# Patient Record
Sex: Male | Born: 1970 | Race: White | Hispanic: No | Marital: Married | Smoking: Never smoker
Health system: Southern US, Community
[De-identification: ages and names within clinical notes are randomized; demographics above are authoritative.]

## PROBLEM LIST (undated history)

## (undated) DIAGNOSIS — E7800 Pure hypercholesterolemia, unspecified: Secondary | ICD-10-CM

## (undated) DIAGNOSIS — G43909 Migraine, unspecified, not intractable, without status migrainosus: Secondary | ICD-10-CM

## (undated) DIAGNOSIS — I10 Essential (primary) hypertension: Secondary | ICD-10-CM

## (undated) DIAGNOSIS — J45909 Unspecified asthma, uncomplicated: Secondary | ICD-10-CM

## (undated) HISTORY — DX: Migraine, unspecified, not intractable, without status migrainosus: G43.909

## (undated) HISTORY — DX: Unspecified asthma, uncomplicated: J45.909

## (undated) HISTORY — DX: Pure hypercholesterolemia, unspecified: E78.00

## (undated) HISTORY — DX: Essential (primary) hypertension: I10

---

## 2008-11-15 ENCOUNTER — Emergency Department (HOSPITAL_BASED_OUTPATIENT_CLINIC_OR_DEPARTMENT_OTHER): Admission: EM | Admit: 2008-11-15 | Discharge: 2008-11-15 | Payer: Self-pay | Admitting: Emergency Medicine

## 2008-11-15 ENCOUNTER — Ambulatory Visit: Payer: Self-pay | Admitting: Diagnostic Radiology

## 2009-06-27 ENCOUNTER — Encounter: Admission: RE | Admit: 2009-06-27 | Discharge: 2009-06-27 | Payer: Self-pay | Admitting: Family Medicine

## 2009-07-18 ENCOUNTER — Encounter: Admission: RE | Admit: 2009-07-18 | Discharge: 2009-07-18 | Payer: Self-pay | Admitting: Family Medicine

## 2009-07-18 ENCOUNTER — Other Ambulatory Visit: Admission: RE | Admit: 2009-07-18 | Discharge: 2009-07-18 | Payer: Self-pay | Admitting: Interventional Radiology

## 2010-05-13 ENCOUNTER — Encounter: Payer: Self-pay | Admitting: Family Medicine

## 2010-06-29 ENCOUNTER — Other Ambulatory Visit: Payer: Self-pay | Admitting: Family Medicine

## 2010-06-29 ENCOUNTER — Other Ambulatory Visit: Payer: Self-pay | Admitting: *Deleted

## 2010-06-29 DIAGNOSIS — E049 Nontoxic goiter, unspecified: Secondary | ICD-10-CM

## 2010-07-03 ENCOUNTER — Ambulatory Visit
Admission: RE | Admit: 2010-07-03 | Discharge: 2010-07-03 | Disposition: A | Discharge status: Home / Self Care | Payer: Self-pay | Source: Ambulatory Visit | Attending: Family Medicine | Admitting: Family Medicine

## 2010-07-03 DIAGNOSIS — E049 Nontoxic goiter, unspecified: Secondary | ICD-10-CM

## 2010-09-16 IMAGING — US US BIOPSY
1 series · 13 of 18 positions shown · non-contrast
Comparison: none

CLINICAL DATA: Patient with history of multinodular goiter and
thyroid ultrasound on 06/27/2009 which revealed a dominant
primarily cystic nodule in the left thyroid lobe measuring 3.4 x
2.3 x 2.7 cm.  Request is now made for needle aspirate biopsy of
this dominant left thyroid nodule.

ULTRASOUND-GUIDED NEEDLE ASPIRATE BIOPSY, DOMINANT LEFT THYROID
CYSTIC NODULE
The above procedure was discussed with the patient and written
informed consent was obtained.

[Series 1: us biopsy · 0.07mm/px · 18 acquisitions, 13 frames shown]
[im 1/18]
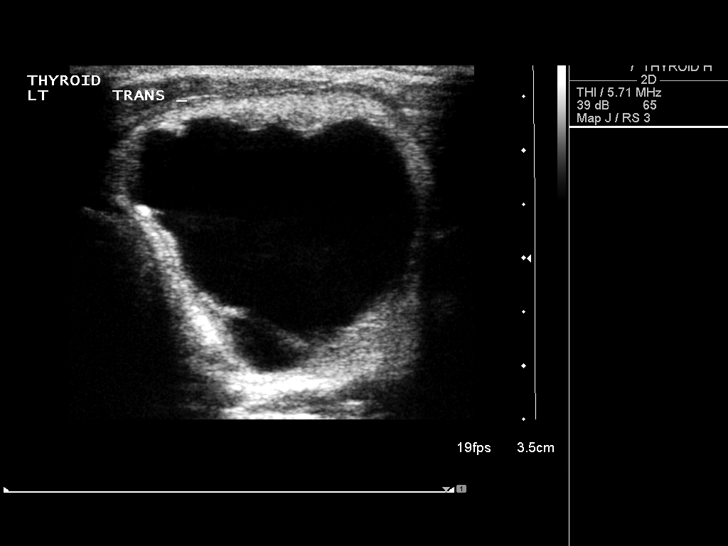
[im 3/18]
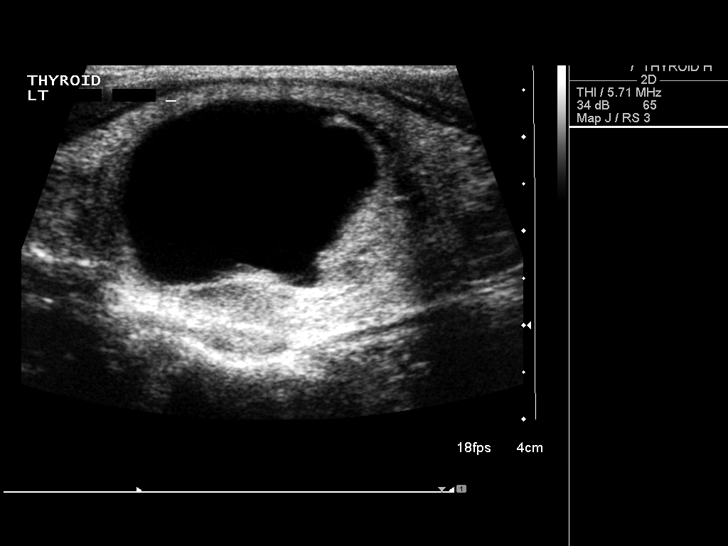
[im 4/18]
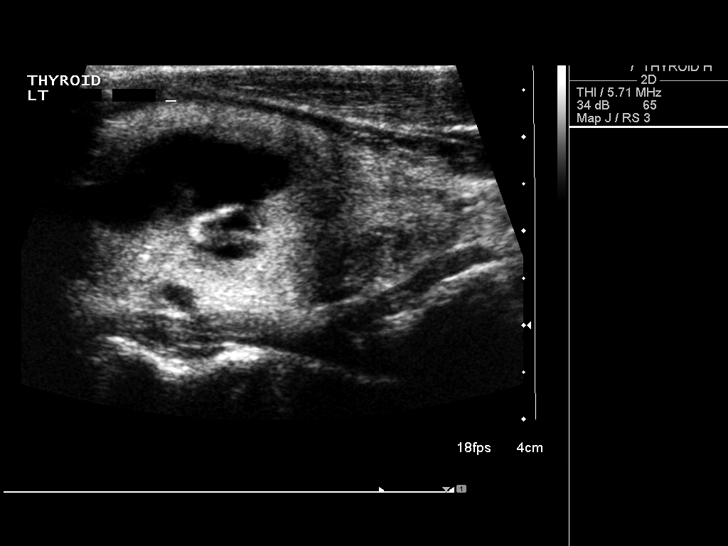
[im 5/18]
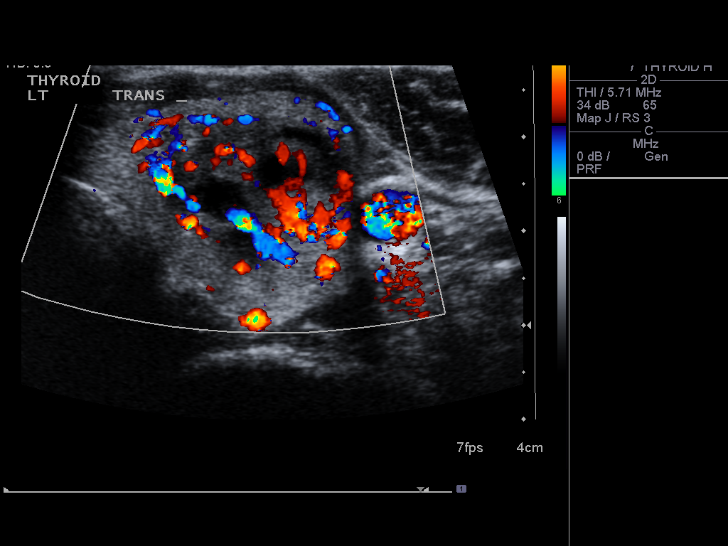
[im 7/18]
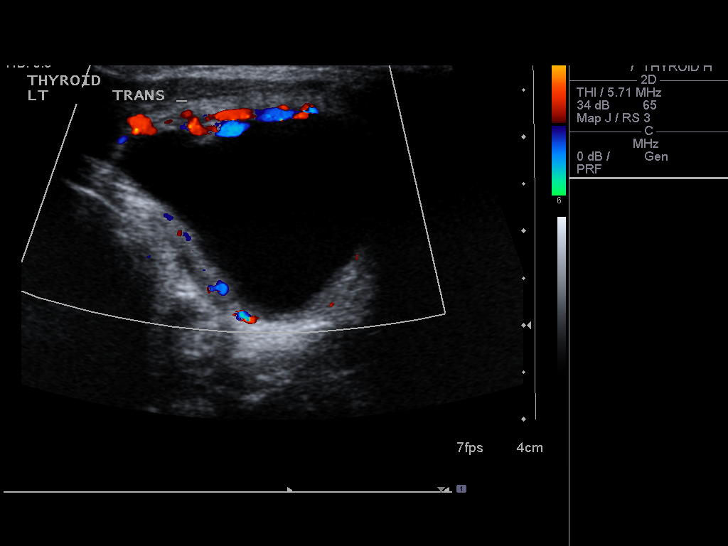
[im 8/18]
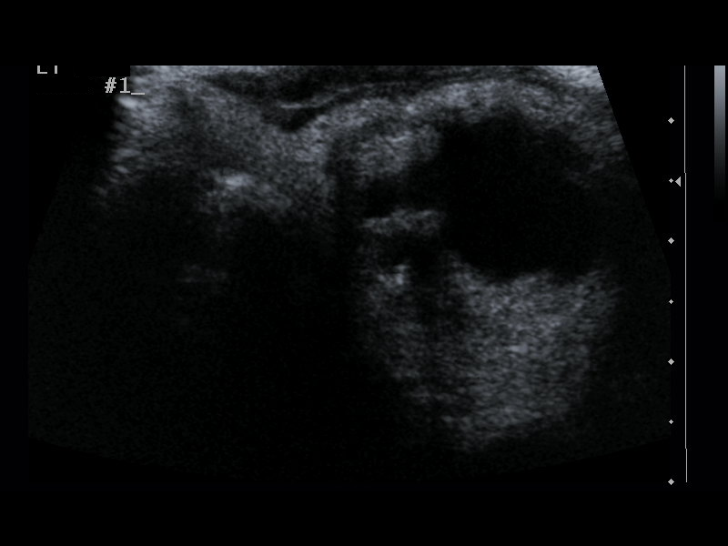
[im 10/18]
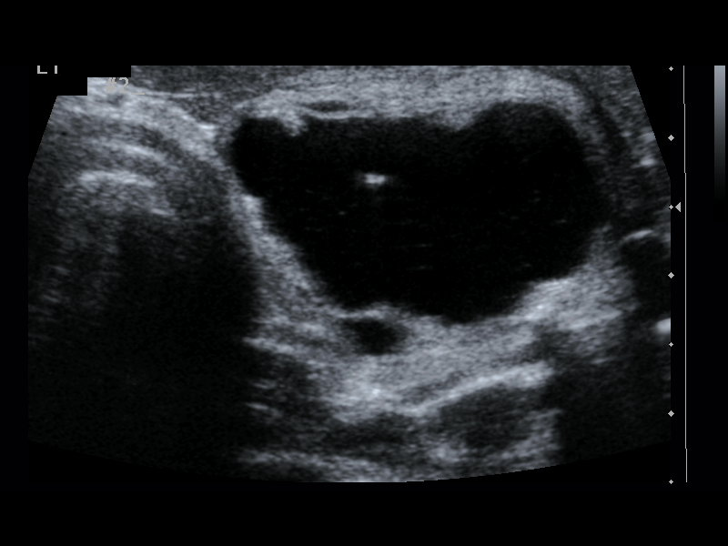
[im 11/18]
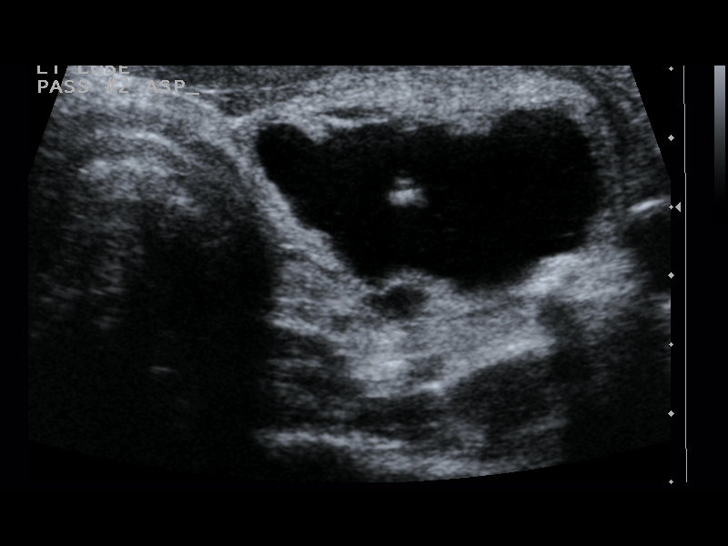
[im 12/18]
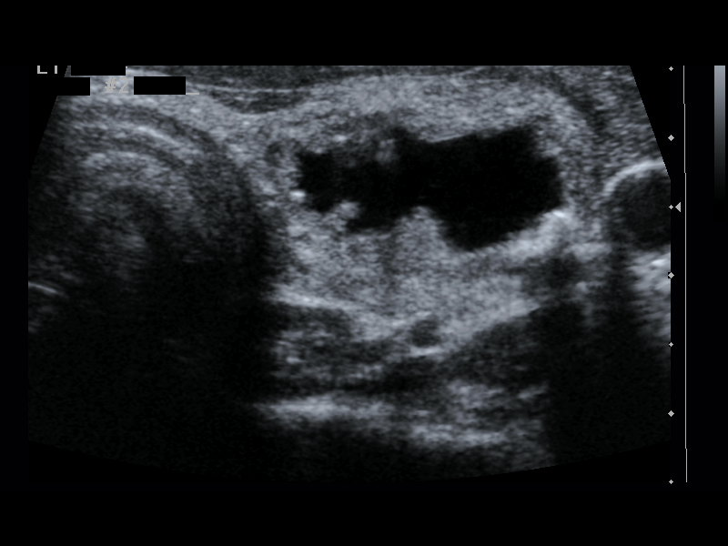
[im 14/18]
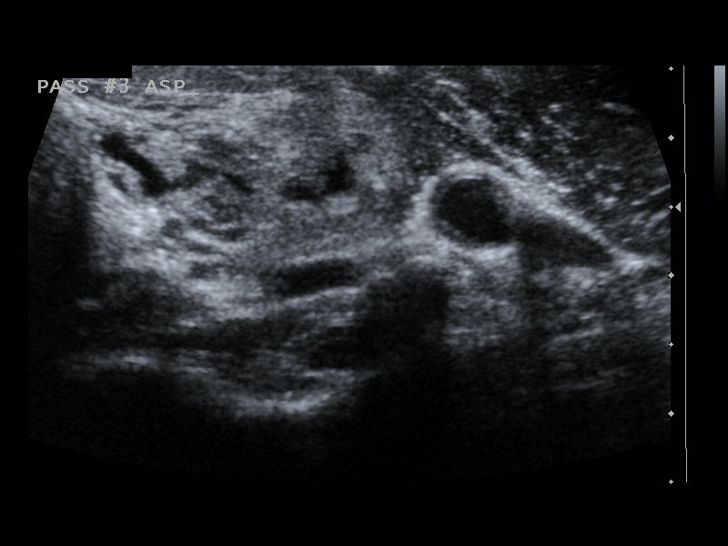
[im 15/18]
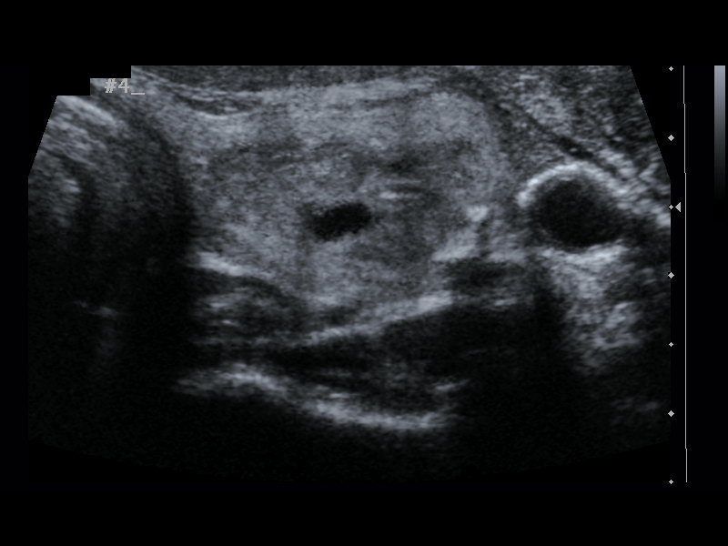
[im 16/18]
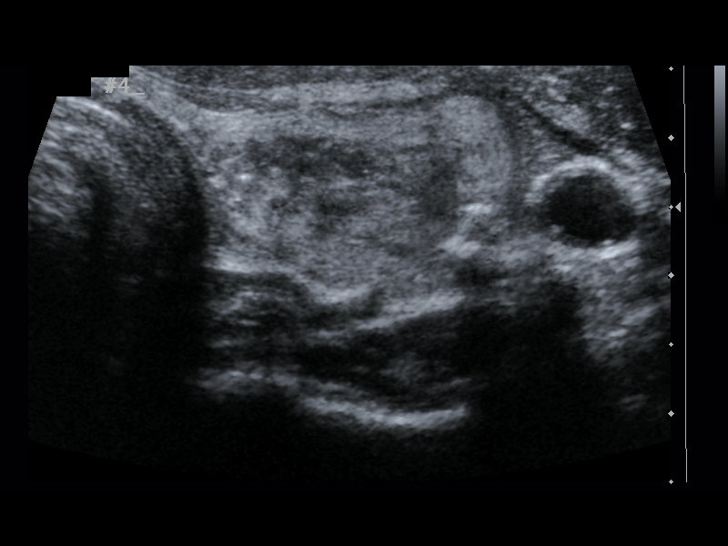
[im 18/18]
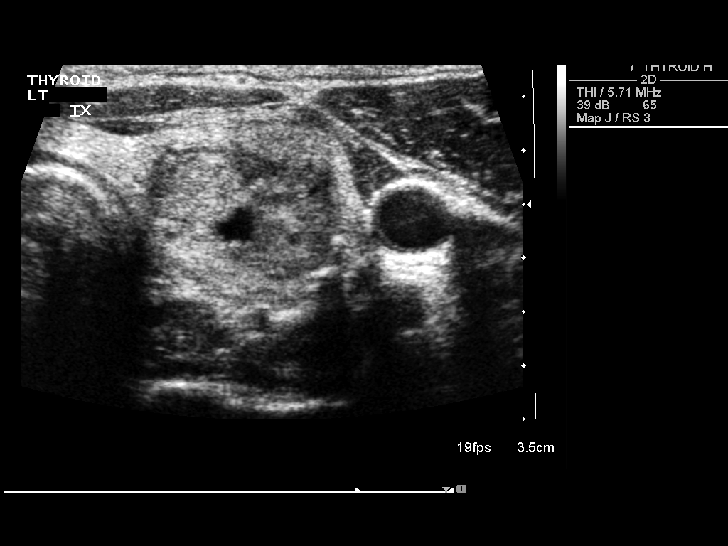

[13 of 18 positions shown; findings below may reference images not displayed]

FINDINGS: Ultrasound was performed to localize and mark an adequate
site for the biopsy.  The patient was then prepped and draped in a
normal sterile fashion.  Local anesthesia was provided with 1%
lidocaine.  Under direct ultrasound guidance, 4 passes were made
using 25 gauge needles into the dominant cystic nodule within the
left lobe of the thyroid.  Ultrasound was used to confirm needle
placements on all occasions.  Specimens were sent to Pathology for
analysis.  Post procedural imaging demonstrated no hematoma or
immediate complication.  The patient tolerated the procedure well.
IMPRESSION: Successful ultrasound guided needle aspirate biopsy of a dominant
cystic left thyroid nodule.  Final pathology pending.

Read by: Mcpeak, Ayushi.-PANTOJA

## 2011-06-27 ENCOUNTER — Other Ambulatory Visit: Payer: Self-pay | Admitting: Family Medicine

## 2011-06-27 DIAGNOSIS — E042 Nontoxic multinodular goiter: Secondary | ICD-10-CM

## 2011-07-26 ENCOUNTER — Other Ambulatory Visit: Payer: Self-pay

## 2011-07-30 ENCOUNTER — Other Ambulatory Visit: Payer: Self-pay

## 2011-08-12 ENCOUNTER — Ambulatory Visit
Admission: RE | Admit: 2011-08-12 | Discharge: 2011-08-12 | Disposition: A | Discharge status: Home / Self Care | Payer: Managed Care, Other (non HMO) | Source: Ambulatory Visit | Attending: Family Medicine | Admitting: Family Medicine

## 2011-08-12 DIAGNOSIS — E042 Nontoxic multinodular goiter: Secondary | ICD-10-CM

## 2013-07-01 ENCOUNTER — Other Ambulatory Visit: Payer: Self-pay | Admitting: Family Medicine

## 2013-07-01 DIAGNOSIS — E049 Nontoxic goiter, unspecified: Secondary | ICD-10-CM

## 2013-07-06 ENCOUNTER — Ambulatory Visit
Admission: RE | Admit: 2013-07-06 | Discharge: 2013-07-06 | Disposition: A | Discharge status: Home / Self Care | Payer: BC Managed Care – PPO | Source: Ambulatory Visit | Attending: Family Medicine | Admitting: Family Medicine

## 2013-07-06 DIAGNOSIS — E049 Nontoxic goiter, unspecified: Secondary | ICD-10-CM

## 2013-07-07 ENCOUNTER — Other Ambulatory Visit: Payer: Self-pay | Admitting: Family Medicine

## 2013-07-07 DIAGNOSIS — E041 Nontoxic single thyroid nodule: Secondary | ICD-10-CM

## 2013-07-13 ENCOUNTER — Other Ambulatory Visit (HOSPITAL_COMMUNITY)
Admission: RE | Admit: 2013-07-13 | Discharge: 2013-07-13 | Disposition: A | Discharge status: Home / Self Care | Payer: BC Managed Care – PPO | Source: Ambulatory Visit | Attending: Interventional Radiology | Admitting: Interventional Radiology

## 2013-07-13 ENCOUNTER — Ambulatory Visit
Admission: RE | Admit: 2013-07-13 | Discharge: 2013-07-13 | Disposition: A | Discharge status: Home / Self Care | Payer: BC Managed Care – PPO | Source: Ambulatory Visit | Attending: Family Medicine | Admitting: Family Medicine

## 2013-07-13 DIAGNOSIS — E041 Nontoxic single thyroid nodule: Secondary | ICD-10-CM

## 2013-12-28 ENCOUNTER — Telehealth: Payer: Self-pay | Admitting: Endocrinology

## 2013-12-28 NOTE — Telephone Encounter (Signed)
Created in error

## 2014-05-17 ENCOUNTER — Other Ambulatory Visit: Payer: Self-pay | Admitting: Family Medicine

## 2014-05-17 DIAGNOSIS — E042 Nontoxic multinodular goiter: Secondary | ICD-10-CM

## 2014-06-21 ENCOUNTER — Ambulatory Visit
Admission: RE | Admit: 2014-06-21 | Discharge: 2014-06-21 | Disposition: A | Discharge status: Home / Self Care | Payer: BLUE CROSS/BLUE SHIELD | Source: Ambulatory Visit | Attending: Family Medicine | Admitting: Family Medicine

## 2014-06-21 DIAGNOSIS — E042 Nontoxic multinodular goiter: Secondary | ICD-10-CM

## 2015-06-14 ENCOUNTER — Other Ambulatory Visit: Payer: Self-pay | Admitting: Family Medicine

## 2015-06-14 DIAGNOSIS — E041 Nontoxic single thyroid nodule: Secondary | ICD-10-CM

## 2015-06-22 ENCOUNTER — Ambulatory Visit
Admission: RE | Admit: 2015-06-22 | Discharge: 2015-06-22 | Disposition: A | Discharge status: Home / Self Care | Payer: BLUE CROSS/BLUE SHIELD | Source: Ambulatory Visit | Attending: Family Medicine | Admitting: Family Medicine

## 2015-06-22 DIAGNOSIS — E041 Nontoxic single thyroid nodule: Secondary | ICD-10-CM

## 2015-12-12 DIAGNOSIS — I10 Essential (primary) hypertension: Secondary | ICD-10-CM | POA: Diagnosis not present

## 2015-12-12 DIAGNOSIS — F419 Anxiety disorder, unspecified: Secondary | ICD-10-CM | POA: Diagnosis not present

## 2015-12-12 DIAGNOSIS — Z0000 Encounter for general adult medical examination without abnormal findings: Secondary | ICD-10-CM | POA: Diagnosis not present

## 2015-12-12 DIAGNOSIS — E785 Hyperlipidemia, unspecified: Secondary | ICD-10-CM | POA: Diagnosis not present

## 2015-12-12 DIAGNOSIS — Z125 Encounter for screening for malignant neoplasm of prostate: Secondary | ICD-10-CM | POA: Diagnosis not present

## 2015-12-12 DIAGNOSIS — N529 Male erectile dysfunction, unspecified: Secondary | ICD-10-CM | POA: Diagnosis not present

## 2016-03-15 DIAGNOSIS — J029 Acute pharyngitis, unspecified: Secondary | ICD-10-CM | POA: Diagnosis not present

## 2016-03-15 DIAGNOSIS — R05 Cough: Secondary | ICD-10-CM | POA: Diagnosis not present

## 2016-03-15 DIAGNOSIS — J069 Acute upper respiratory infection, unspecified: Secondary | ICD-10-CM | POA: Diagnosis not present

## 2016-03-15 DIAGNOSIS — J4531 Mild persistent asthma with (acute) exacerbation: Secondary | ICD-10-CM | POA: Diagnosis not present

## 2016-04-06 DIAGNOSIS — K219 Gastro-esophageal reflux disease without esophagitis: Secondary | ICD-10-CM | POA: Diagnosis not present

## 2016-04-06 DIAGNOSIS — J40 Bronchitis, not specified as acute or chronic: Secondary | ICD-10-CM | POA: Diagnosis not present

## 2016-04-06 DIAGNOSIS — H6980 Other specified disorders of Eustachian tube, unspecified ear: Secondary | ICD-10-CM | POA: Diagnosis not present

## 2016-04-06 DIAGNOSIS — R05 Cough: Secondary | ICD-10-CM | POA: Diagnosis not present

## 2016-05-21 DIAGNOSIS — D3132 Benign neoplasm of left choroid: Secondary | ICD-10-CM | POA: Diagnosis not present

## 2016-05-21 DIAGNOSIS — H35361 Drusen (degenerative) of macula, right eye: Secondary | ICD-10-CM | POA: Diagnosis not present

## 2016-07-21 DIAGNOSIS — J209 Acute bronchitis, unspecified: Secondary | ICD-10-CM | POA: Diagnosis not present

## 2016-07-21 DIAGNOSIS — R030 Elevated blood-pressure reading, without diagnosis of hypertension: Secondary | ICD-10-CM | POA: Diagnosis not present

## 2016-07-21 DIAGNOSIS — R05 Cough: Secondary | ICD-10-CM | POA: Diagnosis not present

## 2017-01-07 DIAGNOSIS — E785 Hyperlipidemia, unspecified: Secondary | ICD-10-CM | POA: Diagnosis not present

## 2017-01-07 DIAGNOSIS — Z125 Encounter for screening for malignant neoplasm of prostate: Secondary | ICD-10-CM | POA: Diagnosis not present

## 2017-01-07 DIAGNOSIS — Z23 Encounter for immunization: Secondary | ICD-10-CM | POA: Diagnosis not present

## 2017-01-07 DIAGNOSIS — F419 Anxiety disorder, unspecified: Secondary | ICD-10-CM | POA: Diagnosis not present

## 2017-01-07 DIAGNOSIS — J4530 Mild persistent asthma, uncomplicated: Secondary | ICD-10-CM | POA: Diagnosis not present

## 2017-01-07 DIAGNOSIS — G43009 Migraine without aura, not intractable, without status migrainosus: Secondary | ICD-10-CM | POA: Diagnosis not present

## 2017-01-08 ENCOUNTER — Other Ambulatory Visit: Payer: Self-pay | Admitting: Family Medicine

## 2017-01-08 DIAGNOSIS — R1013 Epigastric pain: Secondary | ICD-10-CM

## 2017-01-16 ENCOUNTER — Other Ambulatory Visit: Payer: BLUE CROSS/BLUE SHIELD

## 2017-01-23 ENCOUNTER — Ambulatory Visit
Admission: RE | Admit: 2017-01-23 | Discharge: 2017-01-23 | Disposition: A | Discharge status: Home / Self Care | Payer: BLUE CROSS/BLUE SHIELD | Source: Ambulatory Visit | Attending: Family Medicine | Admitting: Family Medicine

## 2017-01-23 DIAGNOSIS — R1013 Epigastric pain: Secondary | ICD-10-CM | POA: Diagnosis not present

## 2017-06-22 DIAGNOSIS — Z20828 Contact with and (suspected) exposure to other viral communicable diseases: Secondary | ICD-10-CM | POA: Diagnosis not present

## 2017-06-22 DIAGNOSIS — J029 Acute pharyngitis, unspecified: Secondary | ICD-10-CM | POA: Diagnosis not present

## 2017-06-22 DIAGNOSIS — R509 Fever, unspecified: Secondary | ICD-10-CM | POA: Diagnosis not present

## 2017-06-22 DIAGNOSIS — B349 Viral infection, unspecified: Secondary | ICD-10-CM | POA: Diagnosis not present

## 2017-07-01 ENCOUNTER — Other Ambulatory Visit: Payer: Self-pay | Admitting: Family Medicine

## 2017-07-01 DIAGNOSIS — E042 Nontoxic multinodular goiter: Secondary | ICD-10-CM

## 2017-07-08 ENCOUNTER — Ambulatory Visit
Admission: RE | Admit: 2017-07-08 | Discharge: 2017-07-08 | Disposition: A | Discharge status: Home / Self Care | Payer: BLUE CROSS/BLUE SHIELD | Source: Ambulatory Visit | Attending: Family Medicine | Admitting: Family Medicine

## 2017-07-08 DIAGNOSIS — G43009 Migraine without aura, not intractable, without status migrainosus: Secondary | ICD-10-CM | POA: Diagnosis not present

## 2017-07-08 DIAGNOSIS — F419 Anxiety disorder, unspecified: Secondary | ICD-10-CM | POA: Diagnosis not present

## 2017-07-08 DIAGNOSIS — E042 Nontoxic multinodular goiter: Secondary | ICD-10-CM

## 2017-07-08 DIAGNOSIS — E785 Hyperlipidemia, unspecified: Secondary | ICD-10-CM | POA: Diagnosis not present

## 2017-07-15 ENCOUNTER — Other Ambulatory Visit: Payer: Self-pay | Admitting: Family Medicine

## 2017-07-15 DIAGNOSIS — E041 Nontoxic single thyroid nodule: Secondary | ICD-10-CM

## 2017-08-14 ENCOUNTER — Ambulatory Visit
Admission: RE | Admit: 2017-08-14 | Discharge: 2017-08-14 | Disposition: A | Discharge status: Home / Self Care | Payer: BLUE CROSS/BLUE SHIELD | Source: Ambulatory Visit | Attending: Family Medicine | Admitting: Family Medicine

## 2017-08-14 ENCOUNTER — Other Ambulatory Visit (HOSPITAL_COMMUNITY)
Admission: RE | Admit: 2017-08-14 | Discharge: 2017-08-14 | Disposition: A | Discharge status: Home / Self Care | Payer: BLUE CROSS/BLUE SHIELD | Source: Ambulatory Visit | Attending: Radiology | Admitting: Radiology

## 2017-08-14 DIAGNOSIS — E041 Nontoxic single thyroid nodule: Secondary | ICD-10-CM | POA: Diagnosis not present

## 2017-08-14 DIAGNOSIS — E0789 Other specified disorders of thyroid: Secondary | ICD-10-CM | POA: Diagnosis not present

## 2018-02-03 DIAGNOSIS — Z125 Encounter for screening for malignant neoplasm of prostate: Secondary | ICD-10-CM | POA: Diagnosis not present

## 2018-02-03 DIAGNOSIS — G43009 Migraine without aura, not intractable, without status migrainosus: Secondary | ICD-10-CM | POA: Diagnosis not present

## 2018-02-03 DIAGNOSIS — E785 Hyperlipidemia, unspecified: Secondary | ICD-10-CM | POA: Diagnosis not present

## 2018-02-03 DIAGNOSIS — F419 Anxiety disorder, unspecified: Secondary | ICD-10-CM | POA: Diagnosis not present

## 2018-02-03 DIAGNOSIS — E042 Nontoxic multinodular goiter: Secondary | ICD-10-CM | POA: Diagnosis not present

## 2018-02-03 DIAGNOSIS — Z23 Encounter for immunization: Secondary | ICD-10-CM | POA: Diagnosis not present

## 2018-02-03 DIAGNOSIS — Z0000 Encounter for general adult medical examination without abnormal findings: Secondary | ICD-10-CM | POA: Diagnosis not present

## 2019-02-16 IMAGING — US US THYROID
1 series · 13 of 25 positions shown · non-contrast
Comparison: 06/22/2015 and previous back to 07/03/2010

CLINICAL DATA: Multinodular goiter. Previous FNA biopsy left nodule
07/13/2013.

EXAM:
THYROID ULTRASOUND
TECHNIQUE: Ultrasound examination of the thyroid gland and adjacent soft
tissues was performed.

[Series 1: us thyroid · 0.04mm/px · 13 of 56 slices shown]
[im 1/56]
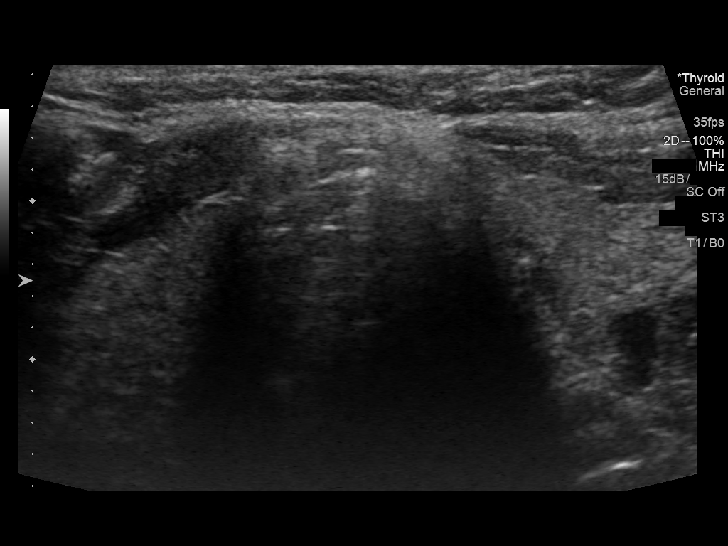
[im 5/56]
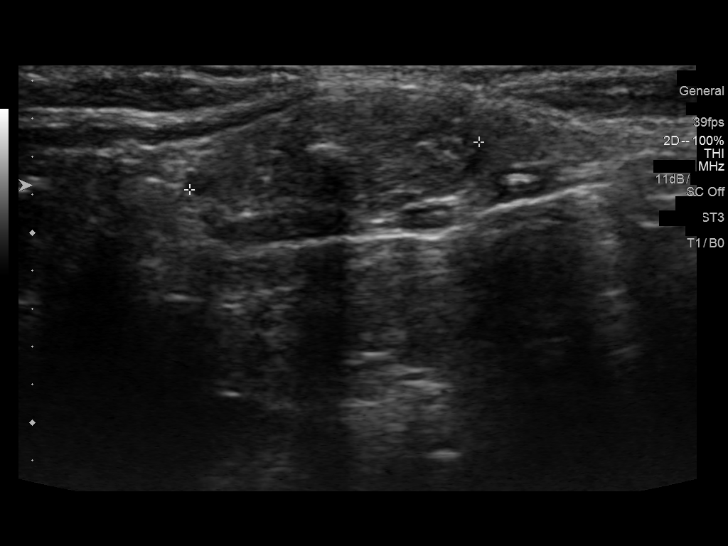
[im 10/56]
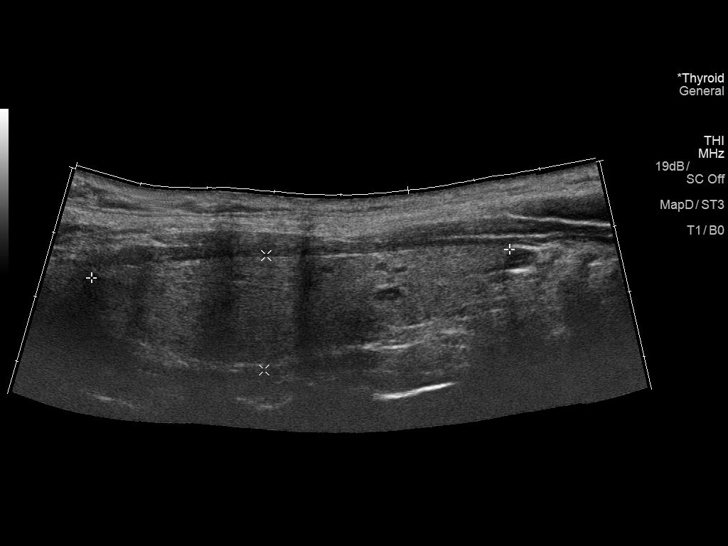
[im 14/56]
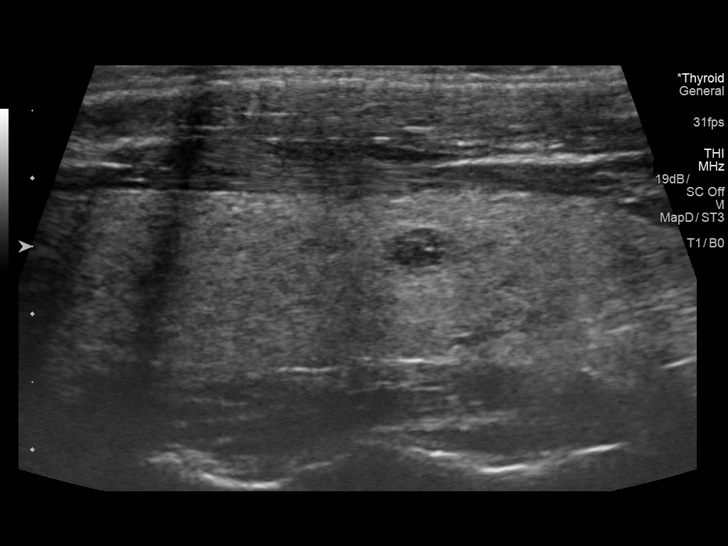
[im 19/56]
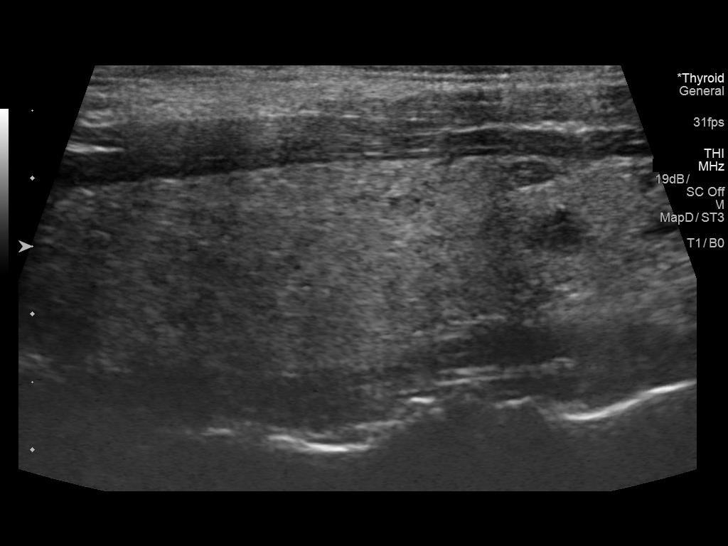
[im 23/56]
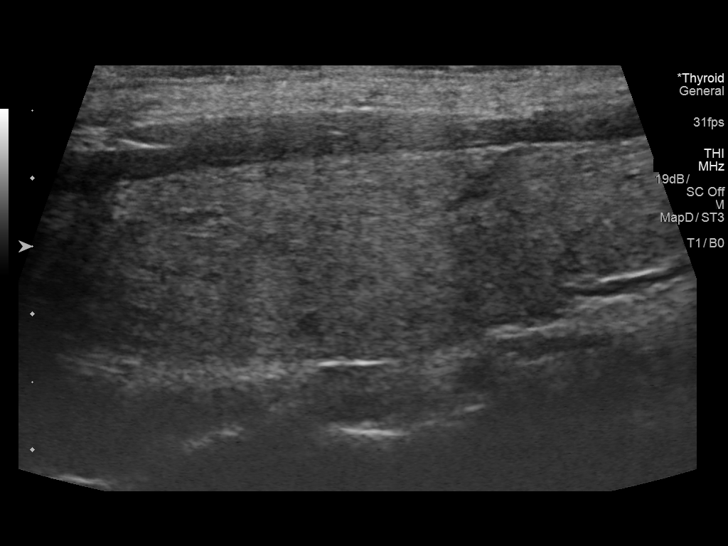
[im 28/56]
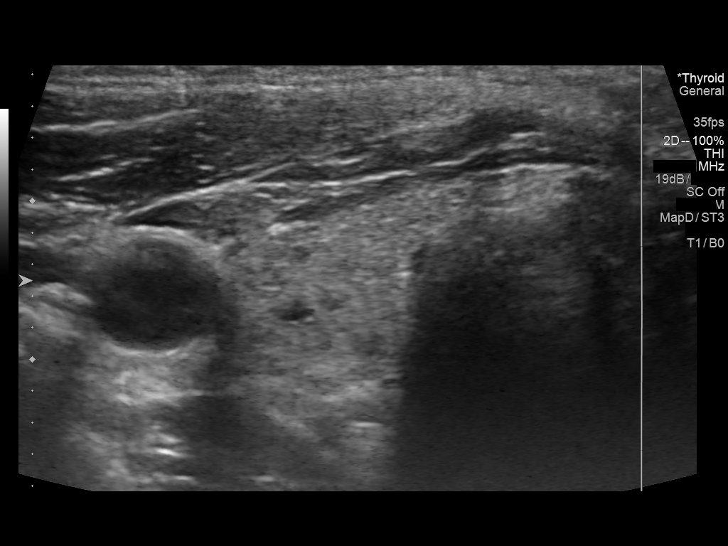
[im 33/56]
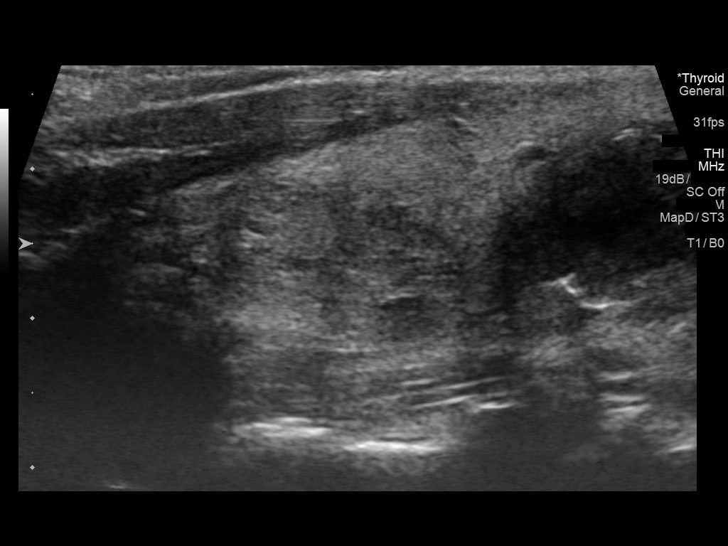
[im 37/56]
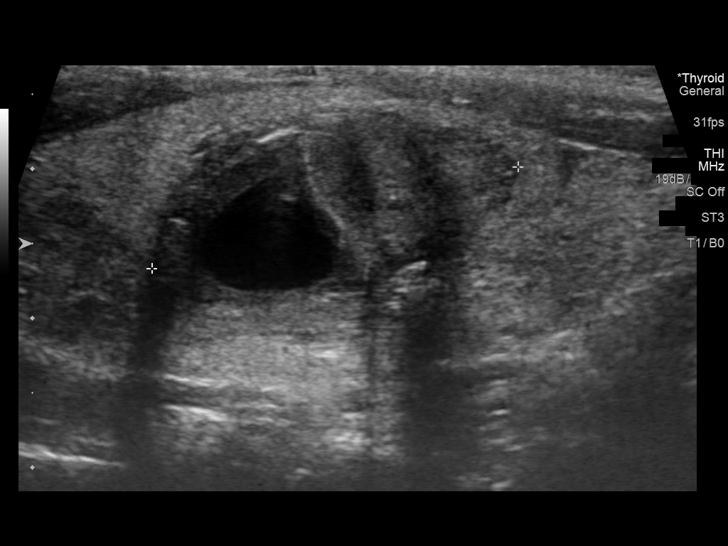
[im 42/56]
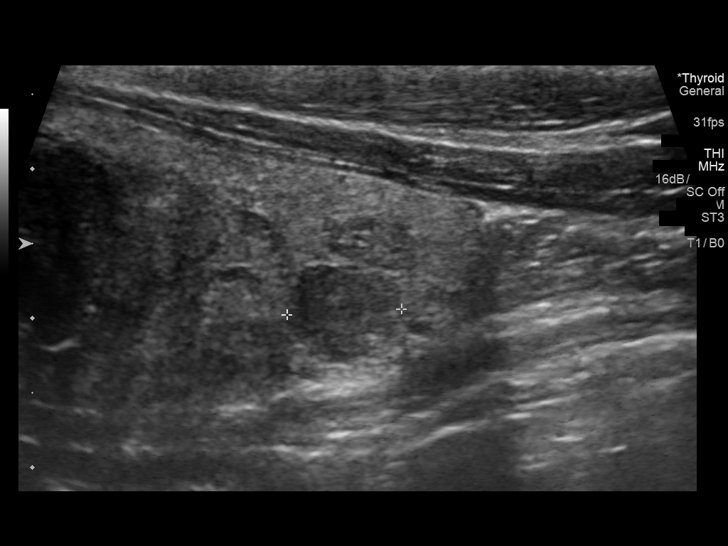
[im 46/56]
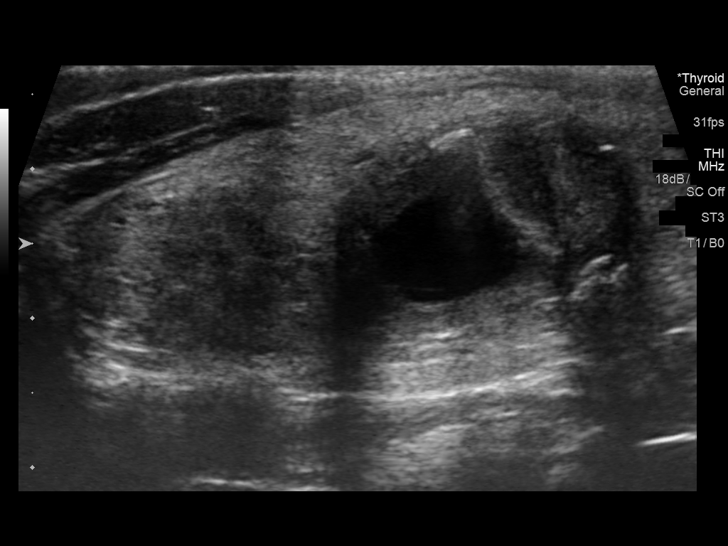
[im 51/56]
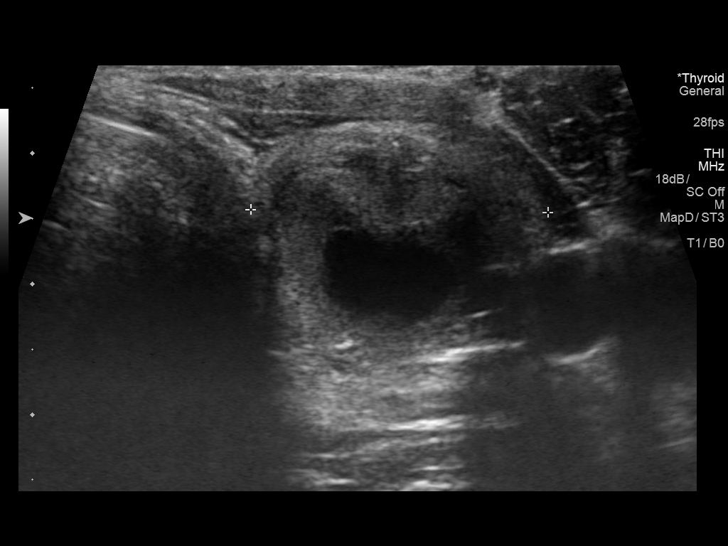
[im 56/56]
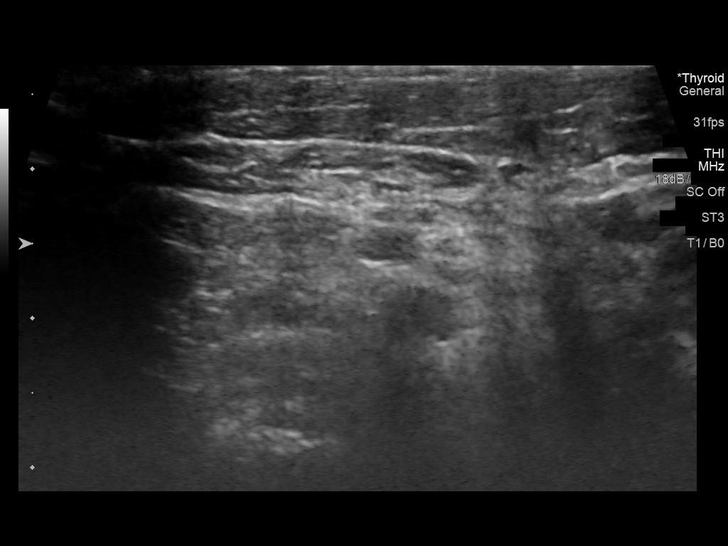

[13 of 25 positions shown; findings below may reference images not displayed]

FINDINGS: Parenchymal Echotexture: Moderately heterogenous

Isthmus: 0.3 cm thickness, stable

Right lobe: 6.3 x 1.7 x 1.7 cm, previously 6.1 x 1.6 x 2

Left lobe: 6.1 x 2 x 2.3 cm, previously 6.3 x 2.4 x

_________________________________________________________

Estimated total number of nodules >/= 1 cm: 3

Number of spongiform nodules >/=  2 cm not described below (TR1): 0

Number of mixed cystic and solid nodules >/= 1.5 cm not described
below (TR2): 0

_________________________________________________________

Nodule # 1:

Prior biopsy: No

Location: Isthmus;

Maximum size: 1.5 cm; Other 2 dimensions: 0.6 x 1.4 cm, previously,
1.4 x 0.6 x 1.3 cm

Composition: solid/almost completely solid (2)

Echogenicity: isoechoic (1)

Shape: not taller-than-wide (0)

Margins: ill-defined (0)

Echogenic foci: macrocalcifications (1)

ACR TI-RADS total points: 4.

ACR TI-RADS risk category:  TR4 (4-6 points).

Significant change in size (>/= 20% in two dimensions and minimal
increase of 2 mm): No

Change in features: No

Change in ACR TI-RADS risk category: No

ACR TI-RADS recommendations:

**Given size (>/= 1.5 cm) and appearance, fine needle aspiration of
this moderately suspicious nodule should be considered based on
TI-RADS criteria.

_________________________________________________________

Nodule # 2:

Prior biopsy: No

Location: Left; Mid

Maximum size: 2.6 cm; Other 2 dimensions: 1.7 x 1.7 cm, previously,
2.4 x 1.8 x 2.1 cm

Composition: mixed cystic and solid (1)

Echogenicity: hypoechoic (2)

Shape: not taller-than-wide (0)

Margins: ill-defined (0)

Echogenic foci: peripheral calcifications (2)

ACR TI-RADS total points: 5.

ACR TI-RADS risk category:  TR4 (4-6 points).

Significant change in size (>/= 20% in two dimensions and minimal
increase of 2 mm): No

Change in features: No

Change in ACR TI-RADS risk category: No

ACR TI-RADS recommendations:

**Given size (>/= 1.5 cm) and appearance, fine needle aspiration of
this moderately suspicious nodule should be considered based on
TI-RADS criteria.

_________________________________________________________

1.7 x 1.1 x 1.3 cm superior left nodule, previously 1.8 x 1.1 x 1;
this was previously biopsied.

Scattered subcentimeter hypoechoic/cystic nodules throughout both
lobes.
IMPRESSION: 1. Stable thyromegaly with bilateral nodules.
2. Recommend FNA biopsy of moderately suspicious 1.5 cm isthmic and
2.6 cm mid left nodules.

The above is in keeping with the ACR TI-RADS recommendations - [HOSPITAL] 4362;[DATE].

## 2019-08-31 ENCOUNTER — Other Ambulatory Visit: Payer: Self-pay | Admitting: Family Medicine

## 2019-08-31 DIAGNOSIS — E042 Nontoxic multinodular goiter: Secondary | ICD-10-CM

## 2019-09-02 ENCOUNTER — Ambulatory Visit: Payer: Self-pay | Attending: Internal Medicine

## 2019-09-02 DIAGNOSIS — Z23 Encounter for immunization: Secondary | ICD-10-CM

## 2019-09-02 NOTE — Progress Notes (Signed)
   Covid-19 Vaccination Clinic  Name:  Xavier Stephenson    MRN: [id number] DOB: January 03, 1971  09/02/2019  Mr. Lesser was observed post Covid-19 immunization for 15 minutes without incident. He was provided with Vaccine Information Sheet and instruction to access the V-Safe system.   [name] was instructed to call 911 with any severe reactions post vaccine: Marland Kitchen Difficulty breathing  . Swelling of face and throat  . A fast heartbeat  . A bad rash all over body  . Dizziness and weakness   Immunizations Administered    Name Date Dose VIS Date Route   Pfizer COVID-19 Vaccine 09/02/2019  4:29 PM 0.3 mL 06/16/2018 Intramuscular   Manufacturer: [employer], [employer]   Lot: [id number]   NDC: [address]-7

## 2019-09-14 ENCOUNTER — Ambulatory Visit
Admission: RE | Admit: 2019-09-14 | Discharge: 2019-09-14 | Disposition: A | Discharge status: Home / Self Care | Payer: BLUE CROSS/BLUE SHIELD | Source: Ambulatory Visit | Attending: Family Medicine | Admitting: Family Medicine

## 2019-09-14 DIAGNOSIS — E042 Nontoxic multinodular goiter: Secondary | ICD-10-CM

## 2019-09-27 ENCOUNTER — Ambulatory Visit: Payer: Commercial Managed Care - PPO | Attending: Internal Medicine

## 2019-09-27 DIAGNOSIS — Z23 Encounter for immunization: Secondary | ICD-10-CM

## 2019-09-27 NOTE — Progress Notes (Signed)
   Covid-19 Vaccination Clinic  Name:  Xavier Stephenson    MRN: [id number] DOB: October 31, 1970  09/27/2019  Xavier Stephenson was observed post Covid-19 immunization for 15 minutes without incident. He was provided with Vaccine Information Sheet and instruction to access the V-Safe system.   Xavier Stephenson was instructed to call 911 with any severe reactions post vaccine: Marland Kitchen Difficulty breathing  . Swelling of face and throat  . A fast heartbeat  . A bad rash all over body  . Dizziness and weakness   Immunizations Administered    Name Date Dose VIS Date Route   Pfizer COVID-19 Vaccine 09/27/2019  4:28 PM 0.3 mL 06/16/2018 Intramuscular   Manufacturer: [employer], [employer]   Lot: [id number]   NDC: [address]-5

## 2020-11-12 IMAGING — US US THYROID
1 series · 13 of 25 positions shown · non-contrast
Comparison: 06/21/2014, biopsy 06/22/2015 and 08/14/2017

CLINICAL DATA: 49-year-old male with a history of multinodular
thyroid

Biopsy performed 08/14/2017 isthmic thyroid nodule and left mid
thyroid nodule
EXAM:
THYROID ULTRASOUND
TECHNIQUE: Ultrasound examination of the thyroid gland and adjacent soft
tissues was performed.

[Series 1: us thyroid · 0.06mm/px · 13 of 85 slices shown]
[im 1/85]
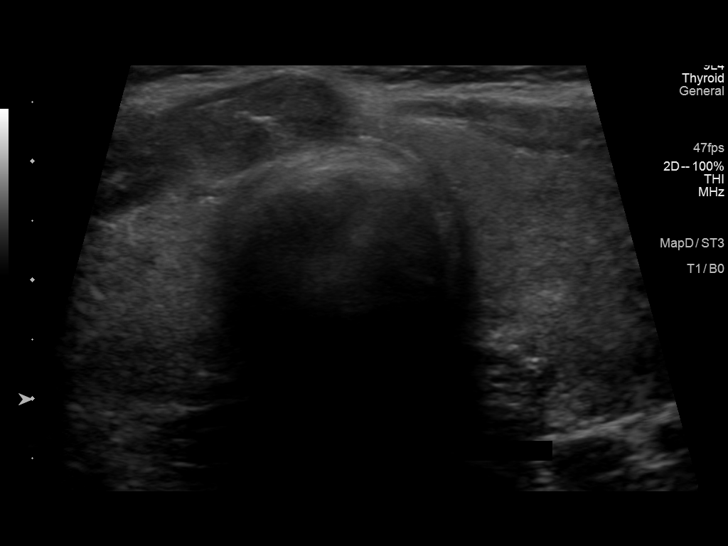
[im 8/85]
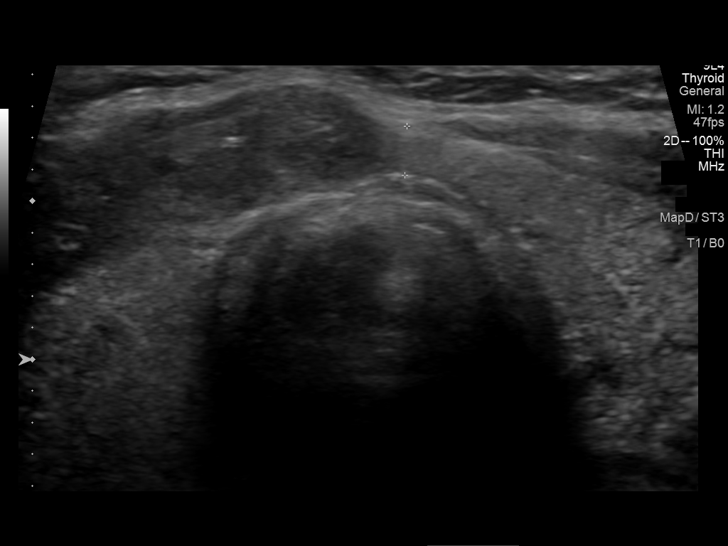
[im 15/85]
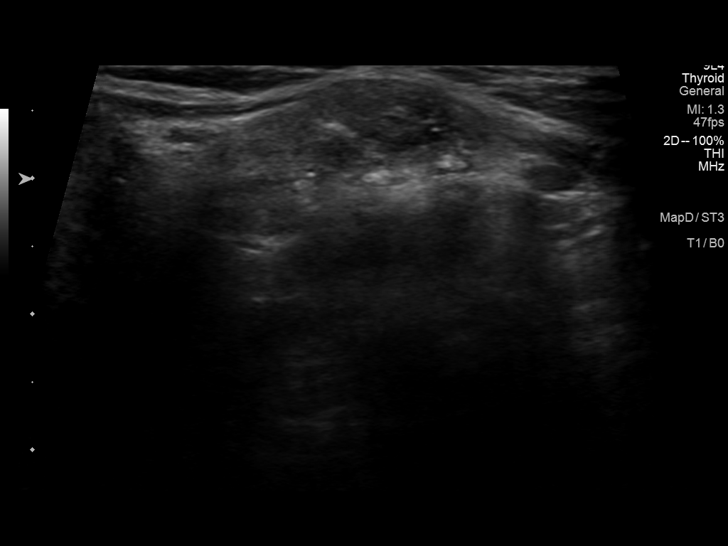
[im 22/85]
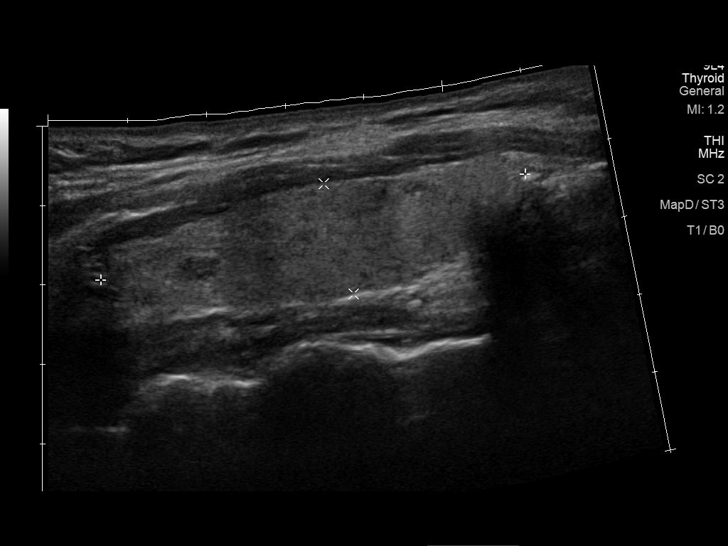
[im 29/85]
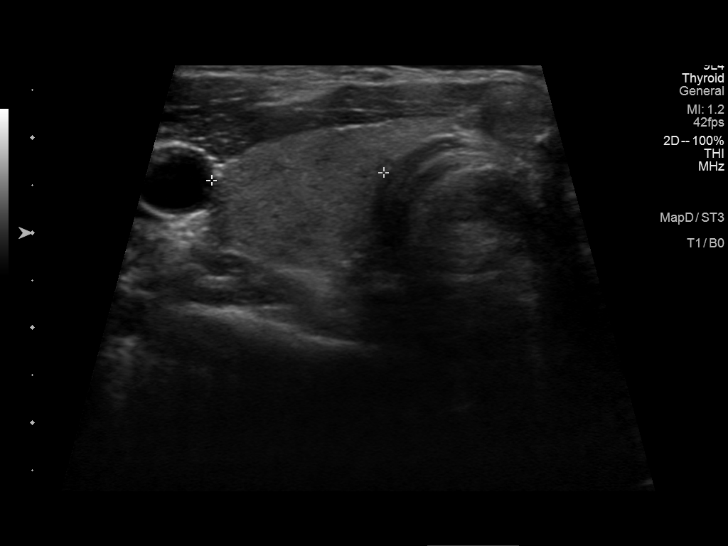
[im 36/85]
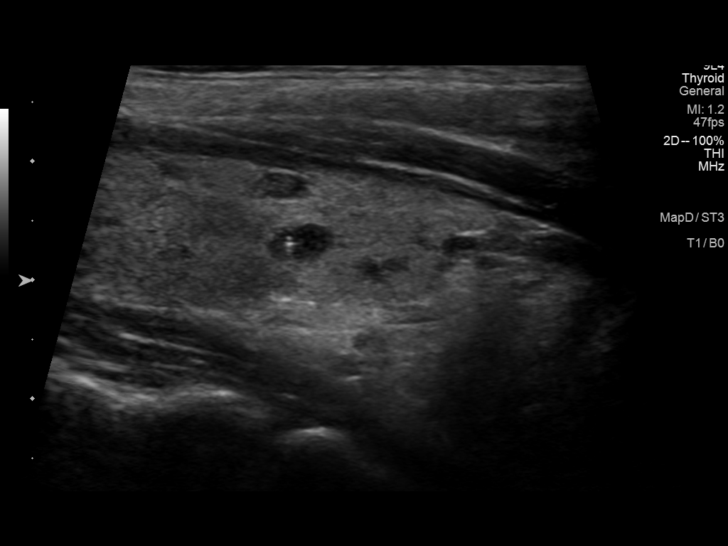
[im 43/85]
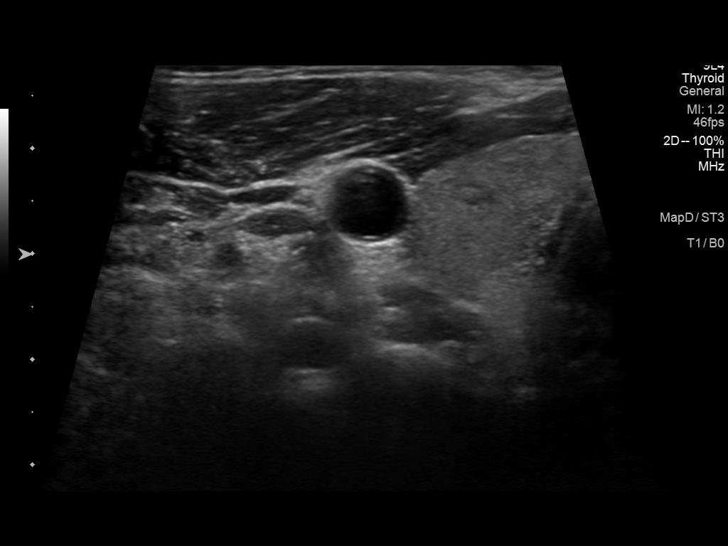
[im 50/85]
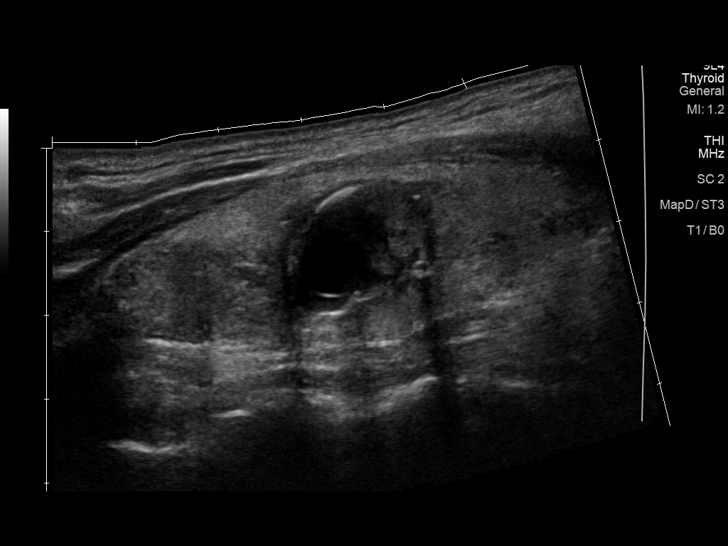
[im 57/85]
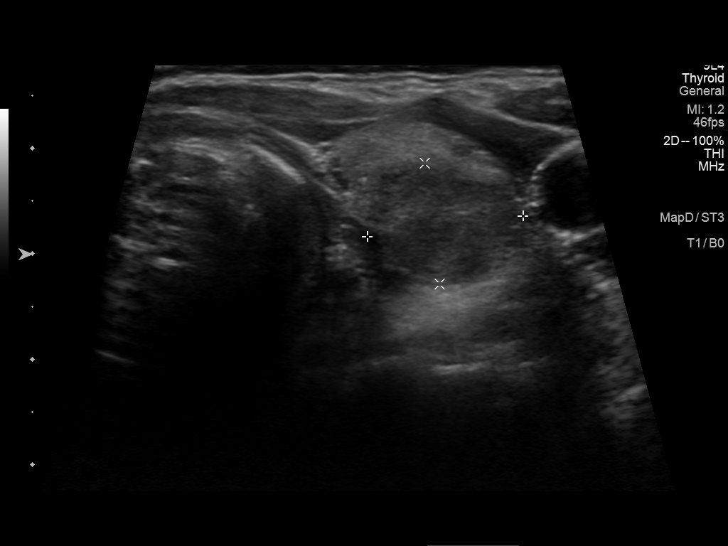
[im 64/85]
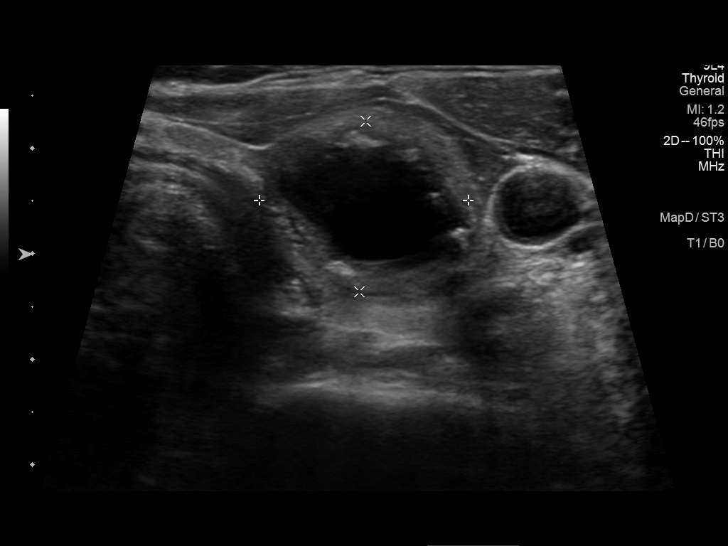
[im 71/85]
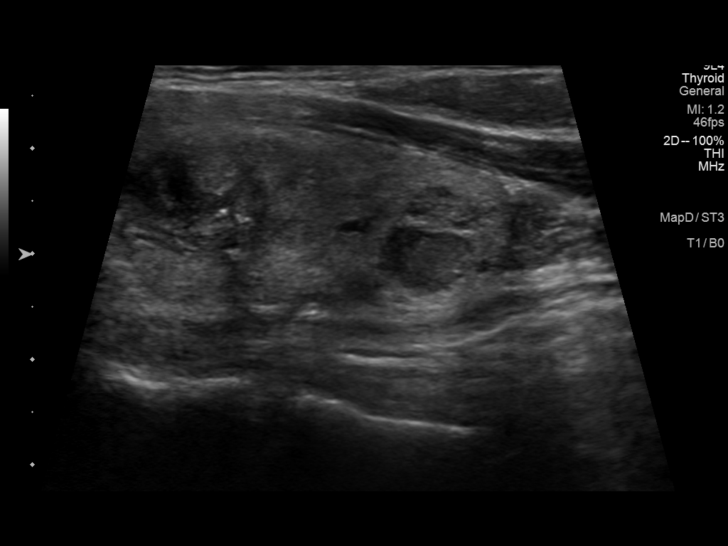
[im 78/85]
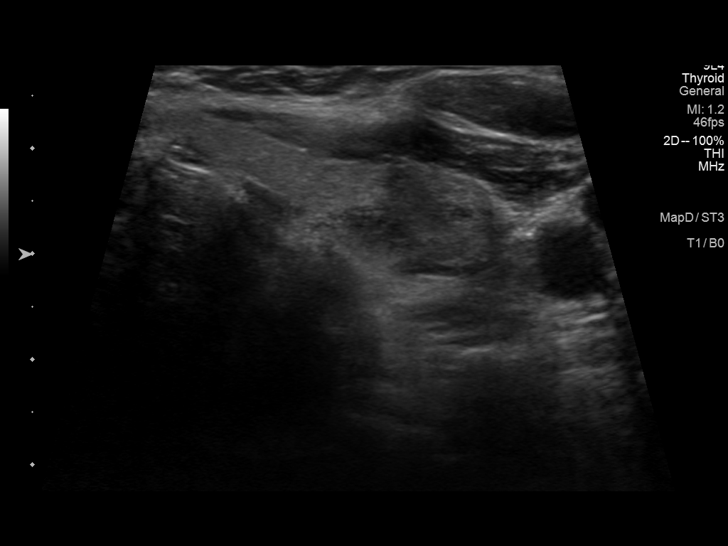
[im 85/85]
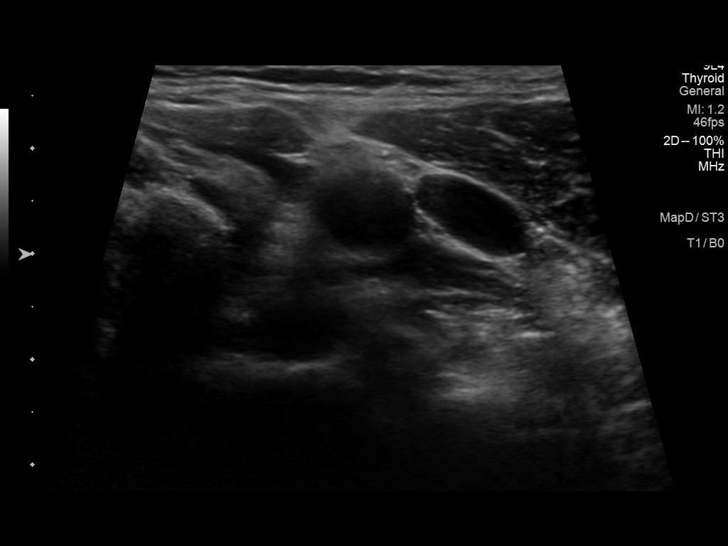

[13 of 25 positions shown; findings below may reference images not displayed]

FINDINGS: Parenchymal Echotexture: Moderately heterogenous

Isthmus: 0.3 cm

Right lobe: 5.5 cm x 1.4 cm x 1.8 cm

Left lobe: 6.5 cm x 2.1 cm x 2.4 cm

_________________________________________________________

Estimated total number of nodules >/= 1 cm: 3

Number of spongiform nodules >/=  2 cm not described below (TR1): 0

Number of mixed cystic and solid nodules >/= 1.5 cm not described
below (TR2): 0

_________________________________________________________

Spongiform nodule within the right thyroid, 7 mm x 5 mm x 4 mm, does
not meet criteria for surveillance or biopsy.

Colloid cyst/nodule in the inferior right thyroid, 6 mm, does not
meet criteria for surveillance or biopsy.

Isthmic nodule measures 1.7 cm, slightly larger than the comparison.
This nodule has been previously biopsied.

Nodule upper left thyroid labeled 2, 2.3 cm, slightly larger than
the comparison. This nodule has been previously biopsied.

Nodule left mid thyroid, labeled 3, 2.2 cm and decreased from the
prior of 2.6 cm. This nodule has been previously biopsied.

Nodule # left 3:

Location: Left; Inferior

Maximum size: 0.9 cm; Other 2 dimensions: 0.9 cm x 0.6 cm

Composition: cannot determine (2)

Echogenicity: hypoechoic (2)

Shape: not taller-than-wide (0)

Margins: smooth (0)

Echogenic foci: none (0)

ACR TI-RADS total points: 4.

ACR TI-RADS risk category: TR4 (4-6 points).

ACR TI-RADS recommendations:

Nodule does not meet criteria for surveillance or biopsy

_________________________________________________________

Nodule # left 4:

Location: Left; Inferior

Maximum size: 0.9 cm; Other 2 dimensions: 0.8 cm x 0.6 cm

Composition: cannot determine (2)

Echogenicity: isoechoic (1)

Shape: not taller-than-wide (0)

Margins: ill-defined (0)

Echogenic foci: none (0)

ACR TI-RADS total points: 3.

ACR TI-RADS risk category: TR3 (3 points).

ACR TI-RADS recommendations:

Nodule does not meet criteria for surveillance or biopsy

_________________________________________________________

No adenopathy
IMPRESSION: Multinodular thyroid again demonstrated.

The isthmic nodule and the larger left-sided thyroid nodules have
all previously been biopsied. Assuming benign result, no further
specific follow-up would be indicated.

No other thyroid nodule meets criteria for biopsy or surveillance,
as designated by the newly established ACR TI-RADS criteria.

Recommendations follow those established by the new ACR TI-RADS
criteria ([HOSPITAL] 1663;[DATE]).

## 2022-05-09 ENCOUNTER — Ambulatory Visit: Payer: BC Managed Care – PPO | Admitting: Cardiology

## 2022-12-03 ENCOUNTER — Ambulatory Visit: Payer: BC Managed Care – PPO | Admitting: Cardiology

## 2022-12-30 ENCOUNTER — Encounter: Payer: Self-pay | Admitting: Cardiology

## 2022-12-30 ENCOUNTER — Ambulatory Visit: Payer: BC Managed Care – PPO | Admitting: Cardiology

## 2022-12-30 VITALS — BP 136/88 | HR 82 | Resp 16 | Ht 73.0 in | Wt 246.4 lb

## 2022-12-30 DIAGNOSIS — I10 Essential (primary) hypertension: Secondary | ICD-10-CM

## 2022-12-30 DIAGNOSIS — R6889 Other general symptoms and signs: Secondary | ICD-10-CM

## 2022-12-30 DIAGNOSIS — E782 Mixed hyperlipidemia: Secondary | ICD-10-CM

## 2022-12-30 DIAGNOSIS — E6609 Other obesity due to excess calories: Secondary | ICD-10-CM

## 2022-12-30 DIAGNOSIS — R1013 Epigastric pain: Secondary | ICD-10-CM

## 2022-12-30 NOTE — Progress Notes (Signed)
ID:  Xavier Stephenson, DOB 1971/03/30, MRN [id number]  PCP:  Laurann Montana, MD  Cardiologist:  Tessa Lerner, DO, [facility] (established care 12/30/22)  REASON FOR CONSULT: Exercise intolerance  REQUESTING PHYSICIAN:  Laurann Montana, MD [phone] [place],  [place] [address]  Chief Complaint  Patient presents with   Eval for Exercise intolerance   New Patient (Initial Visit)    HPI  Mendel Romeo is a 52 y.o. Caucasian male who presents to the clinic for evaluation of exercise intolerance at the request of Laurann Montana, MD. His past medical history and cardiovascular risk factors include: Hypertension, Hyperlipidemia, obesity due to excess calories.   Patient is referred to the practice for evaluation of exercise intolerance.  Patient has had high blood pressure for the last 45 years and has been on medical therapy and recently medications were uptitrated.  After the initiation of new blood pressure pills he has noticed a degree of feeling tired and fatigue.  In addition, he had pulled muscle in the back which has limited his overall functional capacity for the last 5 weeks.  He is now working out about 3 times a week.  After being acclimated to the new blood pressure pills he feels less tired and fatigue compared to before.  He denies anginal chest pain or heart failure symptoms.  Several months ago he started experiencing heartburn for which she was consuming 6-8 antiacid tablets daily and since then has been placed on omeprazole and no longer requires antacid tablets.  He had a heart catheterization in 2014 at [facility] and based on the records available in Care Everywhere 20% lesion in the first diagonal branch otherwise no obstructive disease.  CARDIAC DATABASE: EKG: 12/30/2022: Sinus rhythm, 81 bpm, left axis, without underlying injury pattern.  Echocardiogram: No results found for this or any previous visit from the past 1095 days.   Stress Testing: No results  found for this or any previous visit from the past 1095 days.  Heart catheterization: May 2014 at [facility] see Care Everywhere 1st Diagonal      20% stenosis.                               LMCA                         0                                    LAD                          0                                    Circumflex                   0                                    RCA                          0  Ramus Present:       No  Coronary Dominance:  Right  CONCLUSIONS:  CORONARY STATUS: Normal   LV FUNCTION: 50%, normal wall motion.   ALLERGIES: No Known Allergies  MEDICATION LIST PRIOR TO VISIT: Current Meds  Medication Sig   amLODipine (NORVASC) 5 MG tablet 1 tablet Orally Once a day for 90 days   hydrochlorothiazide (HYDRODIURIL) 25 MG tablet TAKE ONE TABLET BY MOUTH EVERY MORNING for 90 days   omeprazole (PRILOSEC) 40 MG capsule 1 capsule Orally Once a day 30 minutes prior to breakfast for 90 days   PULMICORT FLEXHALER 180 MCG/ACT inhaler 1-2 inhalation Inhalation twice per day   rizatriptan (MAXALT-MLT) 10 MG disintegrating tablet DISSOLVE ONE TABLET ON TONGUE as needed for headache, may repeat once in 2 hours if needed for 30 days   rosuvastatin (CRESTOR) 40 MG tablet take one tablet by mouth one time daily for 90 days   tiZANidine (ZANAFLEX) 4 MG tablet Take 4 mg by mouth at bedtime.   valsartan (DIOVAN) 320 MG tablet TAKE ONE TABLET BY MOUTH ONE TIME DAILY for 90 days   venlafaxine (EFFEXOR) 75 MG tablet Take by mouth.   ZETIA 10 MG tablet 1 tablet Orally Once a day for 90 days   zolpidem (AMBIEN) 5 MG tablet 1 tablet at bedtime as needed Orally Once a day     PAST MEDICAL HISTORY: Past Medical History:  Diagnosis Date   Asthma    Hypercholesteremia    Hypertension    Migraines     PAST SURGICAL HISTORY: History reviewed. No pertinent surgical history.  FAMILY HISTORY: The patient family history includes Diabetes in  his sister; Gout in his father; Hyperlipidemia in his father and mother.  SOCIAL HISTORY:  The patient  reports that he has never smoked. He has never used smokeless tobacco. He reports that he does not currently use alcohol. He reports that he does not use drugs.  REVIEW OF SYSTEMS: Review of Systems  Cardiovascular:  Negative for chest pain, claudication, dyspnea on exertion, irregular heartbeat, leg swelling, near-syncope, orthopnea, palpitations, paroxysmal nocturnal dyspnea and syncope.  Respiratory:  Negative for shortness of breath.   Hematologic/Lymphatic: Negative for bleeding problem.  Musculoskeletal:  Negative for muscle cramps and myalgias.  Neurological:  Negative for dizziness and light-headedness.    PHYSICAL EXAM:    12/30/2022   10:52 AM  Vitals with BMI  Height 6\' 1"   Weight 246 lbs 6 oz  BMI 32.52  Systolic 136  Diastolic 88  Pulse 82    Physical Exam  Constitutional: No distress.  Age appropriate, hemodynamically stable.   Neck: No JVD present.  Cardiovascular: Normal rate, regular rhythm, S1 normal, S2 normal, intact distal pulses and normal pulses. Exam reveals no gallop, no S3 and no S4.  No murmur heard. Pulmonary/Chest: Effort normal and breath sounds normal. No stridor. He has no wheezes. He has no rales.  Abdominal: Soft. Bowel sounds are normal. He exhibits no distension. There is no abdominal tenderness.  Musculoskeletal:        General: No edema.     Cervical back: Neck supple.  Neurological: He is alert and oriented to person, place, and time. He has intact cranial nerves (2-12).  Skin: Skin is warm and moist.    LABORATORY DATA: Will request outside labs.   IMPRESSION:    ICD-10-CM   1. Exercise intolerance  R68.89 EKG 12-Lead    2. Epigastric burning sensation  R10.13 PCV ECHOCARDIOGRAM COMPLETE  PCV CARDIAC STRESS TEST    CT CARDIAC SCORING (SELF PAY ONLY)    3. Benign hypertension  I10     4. Mixed hyperlipidemia  E78.2      5. Class 1 obesity due to excess calories without serious comorbidity with body mass index (BMI) of 32.0 to 32.9 in adult  E66.09    Z68.32        RECOMMENDATIONS: Chivas Watring is a 51 y.o. Caucasian male whose past medical history and cardiac risk factors include: Hypertension, Hyperlipidemia, obesity due to excess calories.   Exercise intolerance Likely secondary to recent back injury and feeling tired and fatigued after correcting his blood pressures. Slowly improving. Now exercising about 3 times per week. Continue to monitor.  Epigastric burning sensation In the recent past has been experiencing epigastric discomfort which she describes as a burning-like sensation. Was taking about 4-6 antacid pills per day until he was started on omeprazole. Though the symptoms likely is of dyspepsia would recommend additional testing to rule out cardiac substrate. Echo will be ordered to evaluate for structural heart disease and left ventricular systolic function. Treadmill stress test to evaluate functional capacity and exercise-induced arrhythmia or ischemia Coronary calcium score Already on omeprazole-managed by PCP  Benign hypertension Office blood pressures are acceptable. Medications reconciled. Reemphasized importance of low-salt diet. Currently managed by primary care provider.  Mixed hyperlipidemia Currently on statin therapy. Does not endorse myalgias. Will obtain records from PCP with regards to fasting lipid.  Class 1 obesity due to excess calories without serious comorbidity with body mass index (BMI) of 32.0 to 32.9 in adult Body mass index is 32.51 kg/m. I reviewed with him importance of diet, regular physical activity/exercise, weight loss.   Patient is educated on the importance of increasing physical activity gradually as tolerated with a goal of moderate intensity exercise for 30 minutes a day 5 days a week.  Data Reviewed: I have independently reviewed external  notes provided by the referring provider as part of this office visit.   I have independently reviewed results of EKG, labs, outside heart catheterization report as part of medical decision making. I have ordered the following tests:  Orders Placed This Encounter  Procedures   CT CARDIAC SCORING (SELF PAY ONLY)    Standing Status:   Future    Standing Expiration Date:   12/30/2023    Order Specific Question:   Preferred imaging location?    Answer:   External   PCV CARDIAC STRESS TEST    Standing Status:   Future    Standing Expiration Date:   12/30/2023   EKG 12-Lead   PCV ECHOCARDIOGRAM COMPLETE    Standing Status:   Future    Standing Expiration Date:   12/30/2023   I have now made medications changes at today's encounter as noted above.  FINAL MEDICATION LIST END OF ENCOUNTER: No orders of the defined types were placed in this encounter.   There are no discontinued medications.   Current Outpatient Medications:    amLODipine (NORVASC) 5 MG tablet, 1 tablet Orally Once a day for 90 days, Disp: , Rfl:    hydrochlorothiazide (HYDRODIURIL) 25 MG tablet, TAKE ONE TABLET BY MOUTH EVERY MORNING for 90 days, Disp: , Rfl:    omeprazole (PRILOSEC) 40 MG capsule, 1 capsule Orally Once a day 30 minutes prior to breakfast for 90 days, Disp: , Rfl:    PULMICORT FLEXHALER 180 MCG/ACT inhaler, 1-2 inhalation Inhalation twice per day, Disp: , Rfl:  rizatriptan (MAXALT-MLT) 10 MG disintegrating tablet, DISSOLVE ONE TABLET ON TONGUE as needed for headache, may repeat once in 2 hours if needed for 30 days, Disp: , Rfl:    rosuvastatin (CRESTOR) 40 MG tablet, take one tablet by mouth one time daily for 90 days, Disp: , Rfl:    tiZANidine (ZANAFLEX) 4 MG tablet, Take 4 mg by mouth at bedtime., Disp: , Rfl:    valsartan (DIOVAN) 320 MG tablet, TAKE ONE TABLET BY MOUTH ONE TIME DAILY for 90 days, Disp: , Rfl:    venlafaxine (EFFEXOR) 75 MG tablet, Take by mouth., Disp: , Rfl:    ZETIA 10 MG tablet, 1  tablet Orally Once a day for 90 days, Disp: , Rfl:    zolpidem (AMBIEN) 5 MG tablet, 1 tablet at bedtime as needed Orally Once a day, Disp: , Rfl:   Orders Placed This Encounter  Procedures   CT CARDIAC SCORING (SELF PAY ONLY)   PCV CARDIAC STRESS TEST   EKG 12-Lead   PCV ECHOCARDIOGRAM COMPLETE    There are no Patient Instructions on file for this visit.   --Continue cardiac medications as reconciled in final medication list. --Return in about 3 months (around 03/31/2023) for Reevaluation of epigastric discomfort and review test results. or sooner if needed. --Continue follow-up with your primary care physician regarding the management of your other chronic comorbid conditions.  Patient's questions and concerns were addressed to his satisfaction. He voices understanding of the instructions provided during this encounter.   This note was created using a voice recognition software as a result there may be grammatical errors inadvertently enclosed that do not reflect the nature of this encounter. Every attempt is made to correct such errors.  Tessa Lerner, [place], [facility]  Pager:  [phone] Office: [phone]

## 2023-01-01 NOTE — Progress Notes (Signed)
External Labs: Collected: October 17, 2022 provided by PCP. TSH 0.54 Hemoglobin 15.5 g/dL, hematocrit 08.6%. BUN 14, creatinine 0.99. Sodium 141, potassium 3.7, chloride 99, bicarb 35 AST 27, ALT 42, alkaline phosphatase 49 Total cholesterol 182, triglycerides 114, HDL 65, LDL calculated 106, non-HDL 127

## 2023-01-23 ENCOUNTER — Other Ambulatory Visit (HOSPITAL_COMMUNITY): Payer: Commercial Managed Care - PPO

## 2023-01-24 ENCOUNTER — Ambulatory Visit (HOSPITAL_BASED_OUTPATIENT_CLINIC_OR_DEPARTMENT_OTHER)
Admission: RE | Admit: 2023-01-24 | Discharge: 2023-01-24 | Disposition: A | Discharge status: Home / Self Care | Payer: BC Managed Care – PPO | Source: Ambulatory Visit | Attending: Cardiology | Admitting: Cardiology

## 2023-01-24 DIAGNOSIS — R1013 Epigastric pain: Secondary | ICD-10-CM | POA: Insufficient documentation

## 2023-01-29 ENCOUNTER — Telehealth: Payer: Self-pay | Admitting: Cardiology

## 2023-01-29 NOTE — Telephone Encounter (Signed)
Follow Up:      Patient says he is returning Mollie's call from yesterday.

## 2023-02-28 ENCOUNTER — Other Ambulatory Visit: Payer: Self-pay

## 2023-02-28 DIAGNOSIS — R6889 Other general symptoms and signs: Secondary | ICD-10-CM

## 2023-02-28 NOTE — Progress Notes (Unsigned)
Order pended for cardiac attestation signature from Dr. Odis Hollingshead.

## 2023-03-02 ENCOUNTER — Other Ambulatory Visit: Payer: Self-pay | Admitting: Cardiology

## 2023-03-04 ENCOUNTER — Ambulatory Visit: Payer: BC Managed Care – PPO

## 2023-03-25 ENCOUNTER — Ambulatory Visit: Payer: BC Managed Care – PPO | Attending: Cardiology

## 2023-03-25 DIAGNOSIS — R1013 Epigastric pain: Secondary | ICD-10-CM | POA: Diagnosis not present

## 2023-03-26 ENCOUNTER — Ambulatory Visit (HOSPITAL_COMMUNITY): Payer: BC Managed Care – PPO | Attending: Cardiology

## 2023-03-26 DIAGNOSIS — I10 Essential (primary) hypertension: Secondary | ICD-10-CM

## 2023-03-26 DIAGNOSIS — R1013 Epigastric pain: Secondary | ICD-10-CM | POA: Insufficient documentation

## 2023-03-26 LAB — EXERCISE TOLERANCE TEST
Angina Index: 0
Duke Treadmill Score: 7
Estimated workload: 9
Exercise duration (min): 7 min
Exercise duration (sec): 20 s
MPHR: 166 {beats}/min
Peak HR: 157 {beats}/min
Percent HR: 94 %
RPE: 15
Rest HR: 99 {beats}/min
ST Depression (mm): 0 mm

## 2023-03-26 LAB — ECHOCARDIOGRAM COMPLETE
Area-P 1/2: 3.74 cm2
S' Lateral: 3.7 cm

## 2023-03-31 ENCOUNTER — Ambulatory Visit: Payer: BC Managed Care – PPO | Attending: Cardiology | Admitting: Cardiology

## 2023-03-31 ENCOUNTER — Encounter: Payer: Self-pay | Admitting: Cardiology

## 2023-03-31 VITALS — BP 142/79 | HR 91 | Resp 16 | Ht 73.0 in | Wt 250.8 lb

## 2023-03-31 DIAGNOSIS — E66811 Obesity, class 1: Secondary | ICD-10-CM

## 2023-03-31 DIAGNOSIS — I10 Essential (primary) hypertension: Secondary | ICD-10-CM

## 2023-03-31 DIAGNOSIS — Z6833 Body mass index (BMI) 33.0-33.9, adult: Secondary | ICD-10-CM

## 2023-03-31 DIAGNOSIS — E782 Mixed hyperlipidemia: Secondary | ICD-10-CM

## 2023-03-31 DIAGNOSIS — I700 Atherosclerosis of aorta: Secondary | ICD-10-CM | POA: Diagnosis not present

## 2023-03-31 DIAGNOSIS — R931 Abnormal findings on diagnostic imaging of heart and coronary circulation: Secondary | ICD-10-CM

## 2023-03-31 DIAGNOSIS — E6609 Other obesity due to excess calories: Secondary | ICD-10-CM

## 2023-03-31 NOTE — Progress Notes (Signed)
Cardiology Office Note:  .   Date:  03/31/2023  ID:  Xavier Stephenson, DOB [birth year], MRN [id number] PCP:  Laurann Montana, MD  Former Cardiology Providers: None [facility] Providers Cardiologist:  Tessa Lerner, DO , [facility] (established care 03/31/23) Electrophysiologist:  None  Click to update primary MD,subspecialty MD or APP then REFRESH:1}    Chief Complaint  Patient presents with   Follow-up    54-month follow-up. Reevaluation of exercise intolerance and review test results    History of Present Illness: .   Xavier Stephenson is a 52 y.o. Caucasian male whose past medical history and cardiovascular risk factors includes: Minimal coronary calcification, aortic atherosclerosis, Hypertension, Hyperlipidemia, obesity due to excess calories.   Patient was referred to the practice for evaluation of exercise intolerance.  At the last office visit shared decision was to proceed forward with cardiovascular testing to see if that was contributory to his presenting symptoms.  Since last office visit he had a coronary calcium score, echo, and a GXT.  Results reviewed with him and noted below for further reference.  Since last office visit he has not had any reoccurrence of chest pain.  His overall physical endurance is slowly improving.  No hospitalizations or urgent care visits for cardiovascular reasons.  Review of Systems: .   Review of Systems  Cardiovascular:  Negative for chest pain, claudication, dyspnea on exertion, irregular heartbeat, leg swelling, near-syncope, orthopnea, palpitations, paroxysmal nocturnal dyspnea and syncope.  Respiratory:  Negative for shortness of breath.   Hematologic/Lymphatic: Negative for bleeding problem.  Musculoskeletal:  Negative for muscle cramps and myalgias.  Neurological:  Negative for dizziness and light-headedness.    Studies Reviewed:   Echocardiogram: December 2024: LVEF 50-55%. Mild LVH. Grade 1 diastolic dysfunction. Aortic root 38  mm. Estimated RAP 3 mmHg   Stress Testing: 03/25/2023   Good exercise capacity, achieved 9.0 METS   Peak heart rate 157 bpm, 54% max age-predicted heart rate   Normal BP response to exercise   No ST deviation was noted.   Low risk study  Coronary artery calcification scoring:  01/24/2023 Left main: 0  Left anterior descending artery: 1.25  Left circumflex artery: 5.83  Right coronary artery: 0  Total: 7.08  Percentile: 34th  Pericardium: Normal.  Ascending Aorta: Normal caliber.  Non-cardiac: 1. Aortic Atherosclerosis (ICD10-I70.0). 2. Small hiatal hernia.   Heart catheterization: May 2014 at [facility] see Care Everywhere 1st Diagonal      20% stenosis.                               LMCA                         0                                    LAD                          0                                    Circumflex                   0  RCA                          0                                      Ramus Present:       No  Coronary Dominance:  Right  CONCLUSIONS:  CORONARY STATUS: Normal   LV FUNCTION: 50%, normal wall motion  RADIOLOGY: NA  Risk Assessment/Calculations:   NA   Labs:      External Labs: Collected: October 17, 2022 provided by PCP. TSH 0.54 Hemoglobin 15.5 g/dL, hematocrit 40.9%. BUN 14, creatinine 0.99. Sodium 141, potassium 3.7, chloride 99, bicarb 35 AST 27, ALT 42, alkaline phosphatase 49 Total cholesterol 182, triglycerides 114, HDL 65, LDL calculated 106, non-HDL 127  Physical Exam:    Today's Vitals   03/31/23 1131  BP: (!) 142/79  Pulse: 91  Resp: 16  SpO2: 97%  Weight: 250 lb 12.8 oz (113.8 kg)  Height: 6\' 1"  (1.854 m)   Body mass index is 33.09 kg/m. Wt Readings from Last 3 Encounters:  03/31/23 250 lb 12.8 oz (113.8 kg)  12/30/22 246 lb 6.4 oz (111.8 kg)    Physical Exam  Constitutional: No distress.  Age appropriate, hemodynamically stable.   Neck: No JVD  present.  Cardiovascular: Normal rate, regular rhythm, S1 normal, S2 normal, intact distal pulses and normal pulses. Exam reveals no gallop, no S3 and no S4.  No murmur heard. Pulmonary/Chest: Effort normal and breath sounds normal. No stridor. He has no wheezes. He has no rales.  Abdominal: Soft. Bowel sounds are normal. He exhibits no distension. There is no abdominal tenderness.  Musculoskeletal:        General: No edema.     Cervical back: Neck supple.  Neurological: He is alert and oriented to person, place, and time. He has intact cranial nerves (2-12).  Skin: Skin is warm and moist.     Impression & Recommendation(s):  Impression:   ICD-10-CM   1. Agatston coronary artery calcium score less than 100  R93.1     2. Atherosclerosis of aorta (HCC)  I70.0     3. Benign hypertension  I10     4. Mixed hyperlipidemia  E78.2     5. Class 1 obesity due to excess calories without serious comorbidity with body mass index (BMI) of 33.0 to 33.9 in adult  E66.811    E66.09    Z68.33        Recommendation(s):  Agatston coronary artery calcium score less than 100 Atherosclerosis of aorta (HCC) Minimal coronary calcification and aortic atherosclerosis on recent coronary calcium scoring. Continue statin therapy. Will hold off on initiation of aspirin given his minimal CAC and the risk for bleeding. GXT low risk study Reemphasized the importance of secondary prevention with focus on improving her modifiable cardiovascular risk factors such as glycemic control, lipid management, blood pressure control, weight loss.  Benign hypertension Office blood pressures are not at goal. Home blood pressures are better controlled. Continue amlodipine 5 mg p.o. daily. Continue hydrochlorothiazide 25 mg p.o. every morning. Continue valsartan 320 mg p.o. daily. Reemphasized importance of low-salt diet.  Mixed hyperlipidemia Currently on rosuvastatin and Zetia.   He denies myalgia or other side  effects. Most recent lipids dated June 2024, independently reviewed as noted above.  LDL 106 mg/dL.  Being on  2 lipid-lowering agents I expect lower numbers.  Patient states that he too agrees.  Likely elevated due to reduced exercise and poor diet.  Class 1 obesity due to excess calories without serious comorbidity with body mass index (BMI) of 33.0 to 33.9 in adult Body mass index is 33.09 kg/m. I reviewed with him importance of diet, regular physical activity/exercise, weight loss.   Patient is educated on the importance of increasing physical activity gradually as tolerated with a goal of moderate intensity exercise for 30 minutes a day 5 days a week.  Orders Placed:  No orders of the defined types were placed in this encounter.  Final Medication List:   No orders of the defined types were placed in this encounter.   There are no discontinued medications.   Current Outpatient Medications:    albuterol (VENTOLIN HFA) 108 (90 Base) MCG/ACT inhaler, Inhale 1 puff into the lungs every 4 (four) hours as needed for shortness of breath., Disp: , Rfl:    amLODipine (NORVASC) 5 MG tablet, 1 tablet Orally Once a day for 90 days, Disp: , Rfl:    hydrochlorothiazide (HYDRODIURIL) 25 MG tablet, TAKE ONE TABLET BY MOUTH EVERY MORNING for 90 days, Disp: , Rfl:    omeprazole (PRILOSEC) 40 MG capsule, 1 capsule Orally Once a day 30 minutes prior to breakfast for 90 days, Disp: , Rfl:    PULMICORT FLEXHALER 180 MCG/ACT inhaler, 1-2 inhalation Inhalation twice per day, Disp: , Rfl:    rizatriptan (MAXALT-MLT) 10 MG disintegrating tablet, DISSOLVE ONE TABLET ON TONGUE as needed for headache, may repeat once in 2 hours if needed for 30 days, Disp: , Rfl:    rosuvastatin (CRESTOR) 40 MG tablet, take one tablet by mouth one time daily for 90 days, Disp: , Rfl:    tiZANidine (ZANAFLEX) 4 MG tablet, Take 4 mg by mouth at bedtime., Disp: , Rfl:    valsartan (DIOVAN) 320 MG tablet, TAKE ONE TABLET BY MOUTH ONE  TIME DAILY for 90 days, Disp: , Rfl:    venlafaxine (EFFEXOR) 75 MG tablet, Take by mouth., Disp: , Rfl:    ZETIA 10 MG tablet, 1 tablet Orally Once a day for 90 days, Disp: , Rfl:    zolpidem (AMBIEN) 5 MG tablet, 1 tablet at bedtime as needed Orally Once a day, Disp: , Rfl:   Consent:   N/A  Disposition:   1 year sooner if needed Patient may be asked to follow-up sooner based on the results of the above-mentioned testing.  His questions and concerns were addressed to his satisfaction. He voices understanding of the recommendations provided during this encounter.    Signed, Tessa Lerner, DO, [facility]  [facility]  [address] [place], [place] [address] 03/31/2023 12:24 PM

## 2023-03-31 NOTE — Patient Instructions (Signed)
